# Patient Record
Sex: Male | Born: 1980 | Race: White | Hispanic: No | Marital: Single | Smoking: Current every day smoker
Health system: Southern US, Community
[De-identification: ages and names within clinical notes are randomized; demographics above are authoritative.]

## PROBLEM LIST (undated history)

## (undated) DIAGNOSIS — F909 Attention-deficit hyperactivity disorder, unspecified type: Secondary | ICD-10-CM

## (undated) HISTORY — PX: NOSE SURGERY: SHX723

## (undated) HISTORY — PX: OTHER SURGICAL HISTORY: SHX169

## (undated) HISTORY — DX: Attention-deficit hyperactivity disorder, unspecified type: F90.9

---

## 2006-10-24 ENCOUNTER — Ambulatory Visit: Payer: Self-pay | Admitting: Unknown Physician Specialty

## 2008-05-30 ENCOUNTER — Emergency Department: Payer: Self-pay | Admitting: Emergency Medicine

## 2008-08-02 ENCOUNTER — Ambulatory Visit: Payer: Self-pay | Admitting: Urology

## 2010-06-17 ENCOUNTER — Emergency Department: Payer: Self-pay | Admitting: Emergency Medicine

## 2011-11-22 ENCOUNTER — Ambulatory Visit: Payer: Self-pay | Admitting: Urology

## 2013-03-22 ENCOUNTER — Ambulatory Visit: Payer: Self-pay

## 2013-04-06 ENCOUNTER — Ambulatory Visit: Payer: Self-pay

## 2014-08-17 IMAGING — CT CT ABDOMEN AND PELVIS WITHOUT AND WITH CONTRAST
2 of 4 series · 13 of 32 positions shown, 18 images · non-contrast
Comparison: none

REASON FOR EXAM: hematuria
COMMENTS:

[Series 4: with 3.0 i40f 3 · axial · 0.78mm/px · z∈[-1055,-677]mm · 8 of 162 slices shown, 13 images]
[im 18/162  soft-tissue]
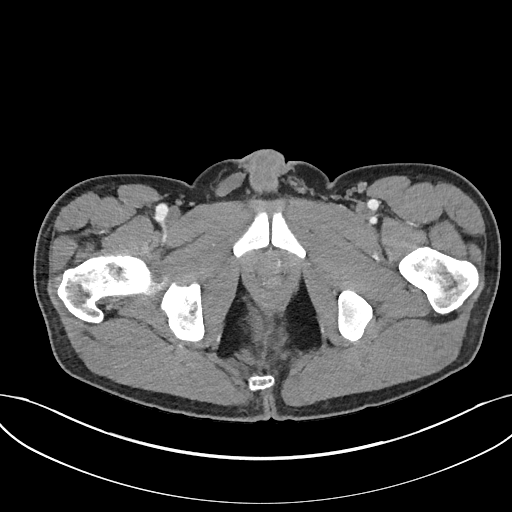
[im 18/162  bone]
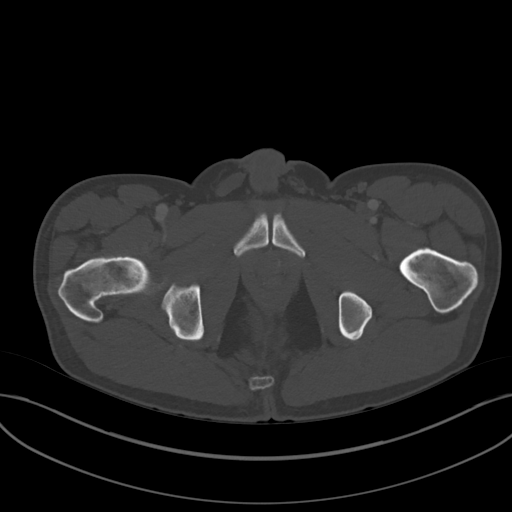
[im 36/162  soft-tissue]
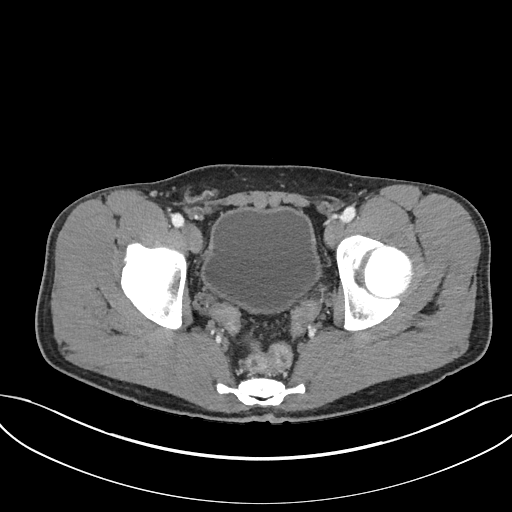
[im 54/162  soft-tissue]
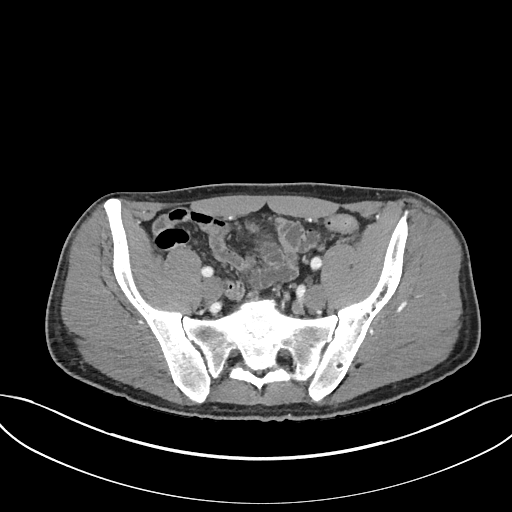
[im 72/162  soft-tissue]
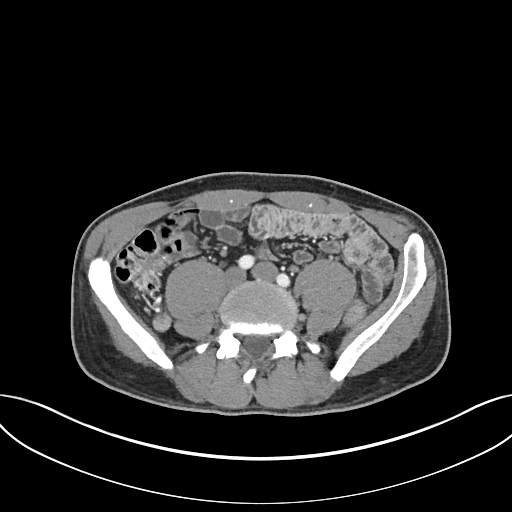
[im 90/162  soft-tissue]
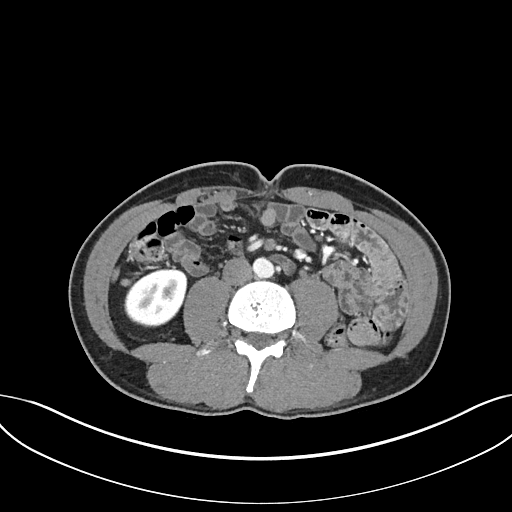
[im 90/162  lung]
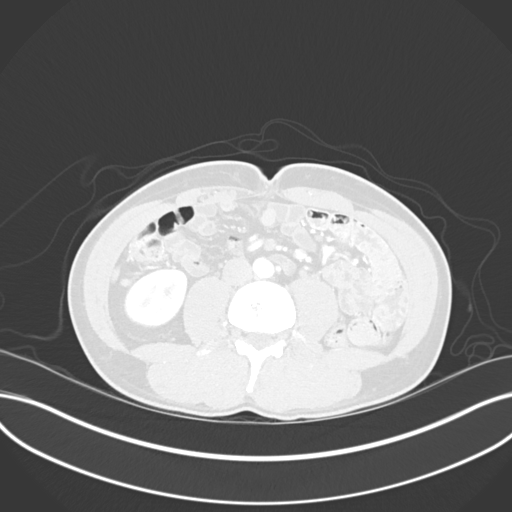
[im 108/162  soft-tissue]
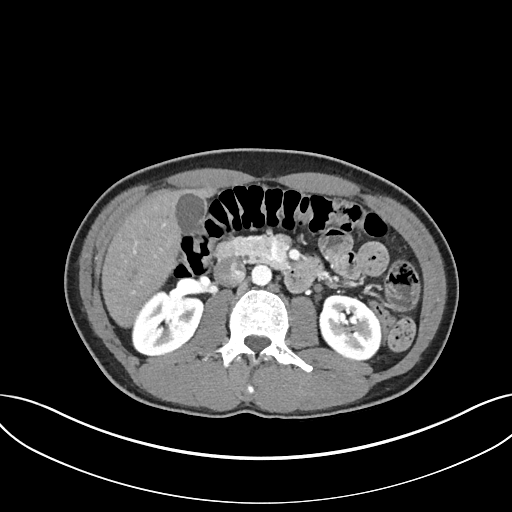
[im 108/162  lung]
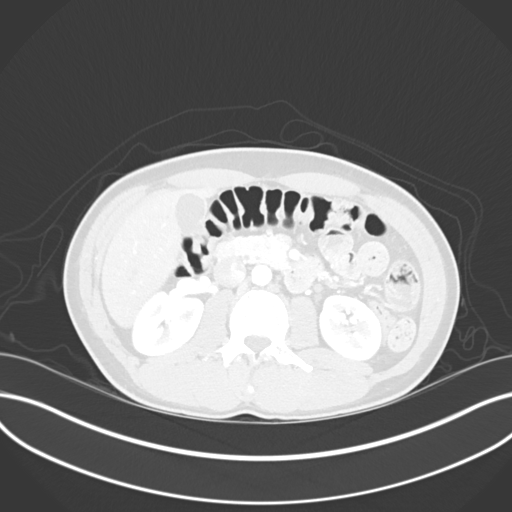
[im 126/162  soft-tissue]
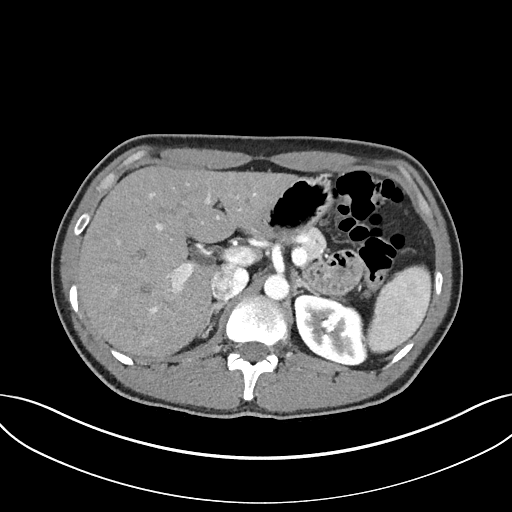
[im 126/162  lung]
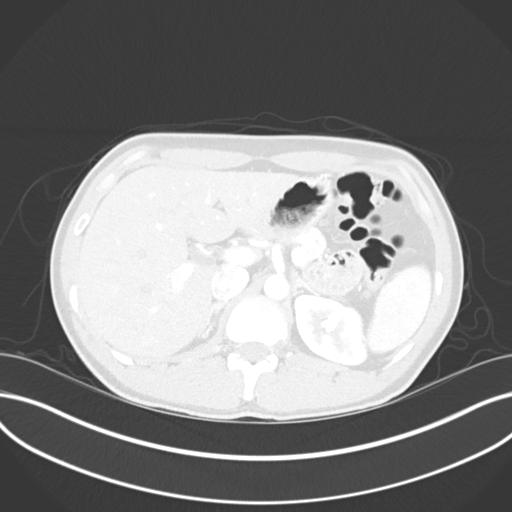
[im 144/162  soft-tissue]
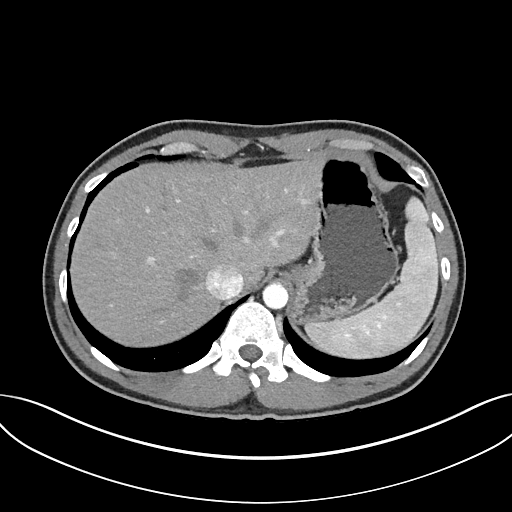
[im 144/162  lung]
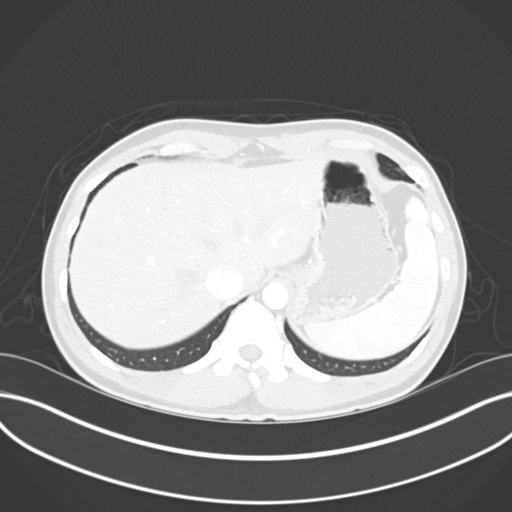

[Series 6: delay 3.0 i40f 3 · axial · delayed · 0.78mm/px · z∈[-1055,-839]mm · 5 of 162 slices shown]
[im 18/162  soft-tissue]
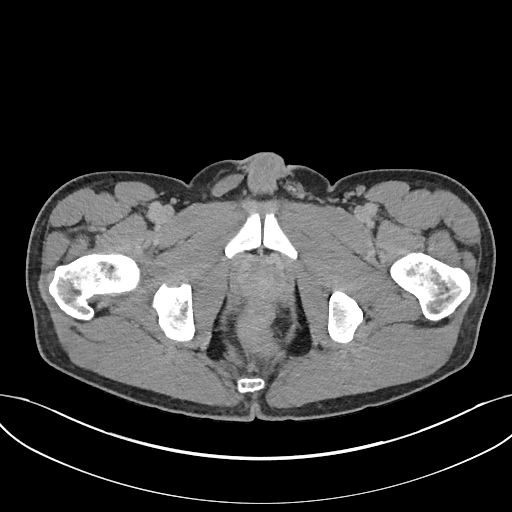
[im 36/162  soft-tissue]
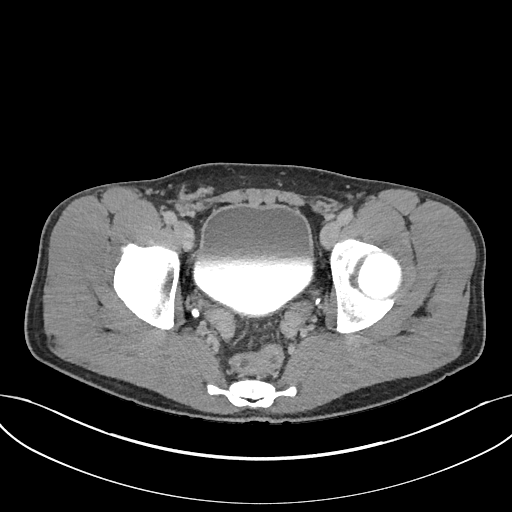
[im 54/162  soft-tissue]
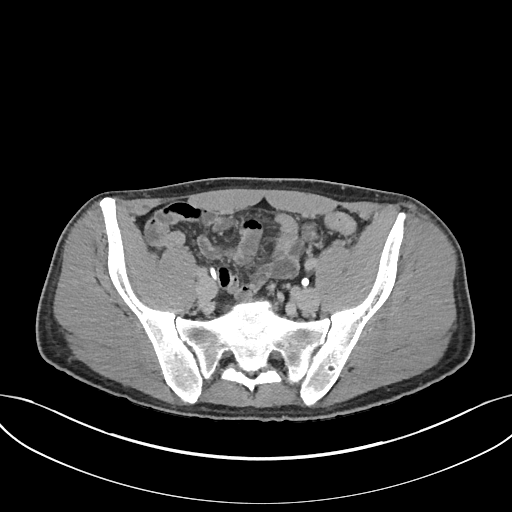
[im 72/162  soft-tissue]
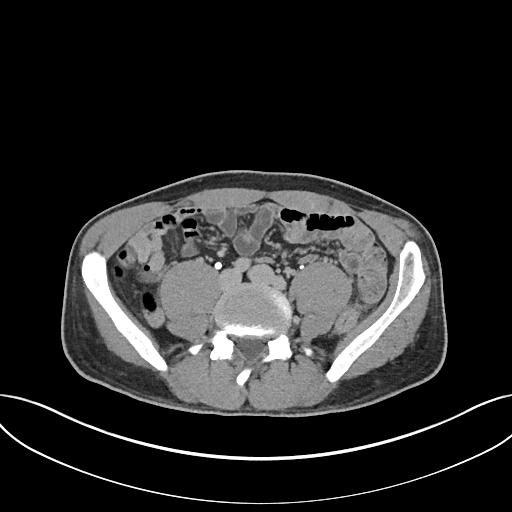
[im 90/162  soft-tissue]
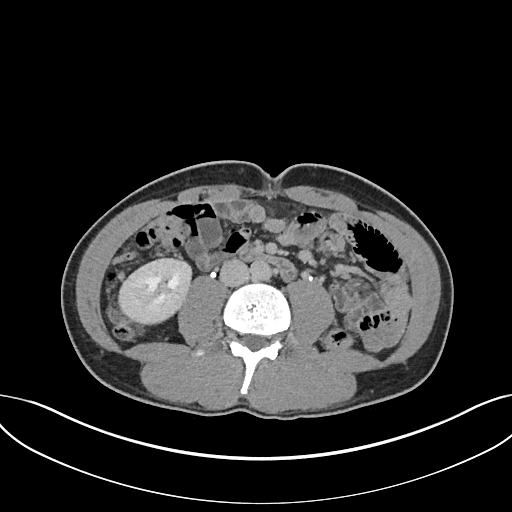

[13 of 32 positions shown; findings below may reference images not displayed]

PROCEDURE:     KCT - KCT ABDOMEN/PELVIS W/WO  - March 22, 2013 [DATE]

RESULT:     A triphasic CT scan was performed through the abdomen and pelvis
with reconstructions at 3 mm intervals and slice thicknesses. The patient
received 100 cc of Psovue-MDQ for the postcontrast and delayed images. The
patient did not receive oral contrast material.

On the noncontrast images the kidneys are normal in contour. There are no
calcified stones or parenchymal calcifications demonstrated. No calcified
ureteral stones are demonstrated. The partially distended urinary bladder
appears normal. Following contrast administration the renal parenchymal
enhancement pattern is normal. On delayed images contrast is visible within
both normal calibered ureters. The partially distended urinary bladder is
normal in appearance. The prostate gland produces a mildly irregular
impression upon the urinary bladder base.

The liver exhibits no focal mass nor ductal dilation. The gallbladder is
adequately distended with no evidence of stones or inflammatory change. The
pancreas, spleen, partially distended stomach, adrenal glands, and abdominal
aorta are normal in appearance. There is no periaortic nor pericaval
lymphadenopathy.

The unopacified loops of small and large bowel exhibit no evidence of ileus
nor of obstruction. The appendix is normal in caliber and partially
gas-filled and lies adjacent to the posterior aspect of the tip of the right
lobe of the liver. The right testicle may be undescended. There is a complex
appearance of the right inguinal canal. A classic inguinal hernia is not
demonstrated.

The lumbar vertebral bodies are preserved in height. The lung bases are
clear.
IMPRESSION: 1. The right testicle may be still located in the inguinal canal. There is
fluid density within a somewhat complex appearing right inguinal canal. The
left inguinal canal is more normal in appearance. Scrotal ultrasound would
be useful.
2. The prostate gland is mildly enlarged and produces regular impression
upon the urinary bladder base.
3. No urinary tract stones or obstruction are demonstrated.
4. I do not see acute bowel abnormality. A normal calibered uninflamed
appendix is demonstrated adjacent to the posterior aspect of the tip of the
right lobe of the liver.

[REDACTED]
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
## 2017-07-22 ENCOUNTER — Emergency Department (HOSPITAL_COMMUNITY)
Admission: EM | Admit: 2017-07-22 | Discharge: 2017-07-22 | Disposition: A | Discharge status: Home / Self Care | Payer: No Typology Code available for payment source | Attending: Emergency Medicine | Admitting: Emergency Medicine

## 2017-07-22 DIAGNOSIS — R0789 Other chest pain: Secondary | ICD-10-CM | POA: Diagnosis present

## 2017-07-22 DIAGNOSIS — Y9389 Activity, other specified: Secondary | ICD-10-CM | POA: Diagnosis not present

## 2017-07-22 DIAGNOSIS — Y999 Unspecified external cause status: Secondary | ICD-10-CM | POA: Insufficient documentation

## 2017-07-22 DIAGNOSIS — Y92410 Unspecified street and highway as the place of occurrence of the external cause: Secondary | ICD-10-CM | POA: Insufficient documentation
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
## 2017-07-22 NOTE — ED Notes (Signed)
Pt seen by PA , pt discharged home

## 2017-07-22 NOTE — ED Notes (Signed)
Pt is wanting to sign out ama . PA at bedside to talk with pt

## 2017-07-22 NOTE — Discharge Instructions (Signed)
Expect your soreness to increase over the next 2-3 days. Take it easy, but do not lay around too much as this may make any stiffness worse.  Antiinflammatory medications: Take 600 mg of ibuprofen every 6 hours or 440 mg (over the counter dose) to 500 mg (prescription dose) of naproxen every 12 hours for the next 3 days. After this time, these medications may be used as needed for pain. Take these medications with food to avoid upset stomach. Choose only one of these medications, do not take them together.  Tylenol: Should you continue to have additional pain while taking the ibuprofen or naproxen, you may add in tylenol as needed. Your daily total maximum amount of tylenol from all sources should be limited to 4000mg /day for persons without liver problems, or 2000mg /day for those with liver problems.  Lidocaine patches: These are available via either prescription or over-the-counter. The over-the-counter option may be more economical one and are likely just as effective. There are multiple over-the-counter brands, such as Salonpas. Exercises: Be sure to perform the attached exercises starting with three times a week and working up to performing them daily. This is an essential part of preventing long term problems.   Follow up with a primary care provider for any future management of these complaints.
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
## 2020-09-14 ENCOUNTER — Other Ambulatory Visit: Payer: Self-pay | Admitting: Family Medicine

## 2020-09-14 DIAGNOSIS — S060X9A Concussion with loss of consciousness of unspecified duration, initial encounter: Secondary | ICD-10-CM

## 2020-09-14 DIAGNOSIS — R110 Nausea: Secondary | ICD-10-CM

## 2020-09-14 DIAGNOSIS — R519 Headache, unspecified: Secondary | ICD-10-CM

## 2020-09-14 DIAGNOSIS — S060X9D Concussion with loss of consciousness of unspecified duration, subsequent encounter: Secondary | ICD-10-CM

## 2020-09-14 DIAGNOSIS — R42 Dizziness and giddiness: Secondary | ICD-10-CM

## 2021-03-22 ENCOUNTER — Encounter: Payer: Self-pay | Admitting: Physician Assistant

## 2021-03-22 ENCOUNTER — Ambulatory Visit: Payer: 59 | Admitting: Physician Assistant

## 2021-03-22 ENCOUNTER — Other Ambulatory Visit: Payer: Self-pay

## 2021-03-22 VITALS — BP 120/90 | HR 100 | Temp 97.8°F | Resp 16 | Ht 72.0 in | Wt 193.0 lb

## 2021-03-22 DIAGNOSIS — F909 Attention-deficit hyperactivity disorder, unspecified type: Secondary | ICD-10-CM | POA: Diagnosis not present

## 2021-03-22 DIAGNOSIS — Z7689 Persons encountering health services in other specified circumstances: Secondary | ICD-10-CM

## 2021-03-22 DIAGNOSIS — E782 Mixed hyperlipidemia: Secondary | ICD-10-CM | POA: Diagnosis not present

## 2021-03-22 DIAGNOSIS — R5383 Other fatigue: Secondary | ICD-10-CM

## 2021-03-22 DIAGNOSIS — R7989 Other specified abnormal findings of blood chemistry: Secondary | ICD-10-CM | POA: Diagnosis not present

## 2021-03-22 MED ORDER — AMPHETAMINE-DEXTROAMPHET ER 30 MG PO CP24: 30.0000 mg | ORAL_CAPSULE | Freq: Every day | ORAL | 0 refills | Status: DC

## 2021-03-22 MED ORDER — AMPHETAMINE-DEXTROAMPHETAMINE 10 MG PO TABS: 10.0000 mg | ORAL_TABLET | Freq: Every day | ORAL | 0 refills | Status: DC

## 2021-03-22 MED ORDER — PNEUMOCOCCAL 20-VAL CONJ VACC 0.5 ML IM SUSY: 0.5000 mL | PREFILLED_SYRINGE | INTRAMUSCULAR | 0 refills | Status: AC

## 2021-03-22 NOTE — Progress Notes (Signed)
[facility] [address] [place], [place] [address]  Internal MEDICINE  Office Visit Note  Patient Name: Henry Salas  [id number]  [id number]  Date of Service: 03/25/2021   Complaints/HPI Pt is here for establishment of PCP. Chief Complaint  Patient presents with   New Patient (Initial Visit)    Anxiety, wants epi pen, discuss deviated septum    HPI He is here to establish care -His fiance comes here and recommended he come establish care here. Reports issues with prior PCP getting meds filled. -he works as a Merchandiser, retail at [employer]. Goes to work at [employer] and then doesn't get home until early morning. Working to adjust and start driving on the truck and getting normal shift. -Sleeps 7-8 hours -He has a dog -enjoys wakeboarding and traveling to beach and mountains -Used to dip tobacco and quit, he is vaping now everyday but is trying to quit -Mother had a stoke 81months ago and working on getting her into an assisted living and has been stressful -Does not drink any alcohol, denies any other substance use -He has been without testosterone since end of June. Has been feeling more fatigued and is looking to get this prescription filled today. Did discuss that we will need to obtain labs first -He also had ADHD and takes adderall 20mg  in AM plus additional 10mg  pill in Am and then another 10mg  in PM, skips weekends and days he is not working. Would be interesting in changing to 30mg  dose in Am with 10mg  in PM rather than having to take a 20mg  and a 10mg  separately in the AM. Discussed that a 30mg  XR pill could be given and that he should try to go without the 10mg  in PM to see if the higher XR dosing works well on its own. -Denies any problems with sleep or any heart palpitations -Additionally discussed that his BP will have to remain under control -Patient did bring in his medication bottles for his adderall and testosterone gel  Current Medication: Outpatient Encounter  Medications as of 03/22/2021  Medication Sig   amphetamine-dextroamphetamine (ADDERALL XR) 30 MG 24 hr capsule Take 1 capsule (30 mg total) by mouth daily.   amphetamine-dextroamphetamine (ADDERALL) 10 MG tablet Take 1 tablet (10 mg total) by mouth daily with breakfast.   Testosterone 1.62 % GEL Place onto the skin.   [DISCONTINUED] amphetamine-dextroamphetamine (ADDERALL) 10 MG tablet Take 10 mg by mouth daily with breakfast.   [DISCONTINUED] amphetamine-dextroamphetamine (ADDERALL) 20 MG tablet Take 20 mg by mouth daily.   [DISCONTINUED] pneumococcal 20-Val Conj Vacc (PREVNAR 20) 0.5 ML injection Inject 0.5 mLs into the muscle tomorrow at 10 am.   [EXPIRED] pneumococcal 20-Val Conj Vacc (PREVNAR 20) 0.5 ML injection Inject 0.5 mLs into the muscle tomorrow at 10 am for 1 dose.   No facility-administered encounter medications on file as of 03/22/2021.    Surgical History: Past Surgical History:  Procedure Laterality Date   NOSE SURGERY     ulnar nerve deep compression Left     Medical History: Past Medical History:  Diagnosis Date   ADHD     Family History: Family History  Problem Relation Age of Onset   Hyperlipidemia Mother    Depression Mother    Hypertension Mother    Diabetes Mother    Stroke Mother    Arthritis Mother    Hyperlipidemia Father    Cancer Father    Cancer Paternal Uncle    Stroke Maternal Grandmother    Hyperlipidemia  Maternal Grandmother    Diabetes Maternal Grandmother    Heart disease Maternal Grandmother    Hyperlipidemia Maternal Grandfather    COPD Maternal Grandfather    Heart disease Maternal Grandfather    Hyperlipidemia Paternal Grandmother    Cancer Paternal Grandmother    Hyperlipidemia Paternal Grandfather    Heart disease Paternal Grandfather     Social History   Socioeconomic History   Marital status: Single    Spouse name: Not on file   Number of children: Not on file   Years of education: Not on file   Highest education level:  Not on file  Occupational History   Not on file  Tobacco Use   Smoking status: Every Day    Types: E-cigarettes   Smokeless tobacco: Former    Types: Chew  Substance and Sexual Activity   Alcohol use: Yes    Comment: 2 drinks a year   Drug use: Never   Sexual activity: Not on file  Other Topics Concern   Not on file  Social History Narrative   Not on file   Social Determinants of Health   Financial Resource Strain: Not on file  Food Insecurity: Not on file  Transportation Needs: Not on file  Physical Activity: Not on file  Stress: Not on file  Social Connections: Not on file  Intimate Partner Violence: Not on file     Review of Systems  Constitutional:  Positive for fatigue. Negative for chills and unexpected weight change.  HENT:  Negative for congestion, postnasal drip, rhinorrhea, sneezing and sore throat.   Eyes:  Negative for redness.  Respiratory:  Negative for cough, chest tightness and shortness of breath.   Cardiovascular:  Negative for chest pain and palpitations.  Gastrointestinal:  Negative for abdominal pain, constipation, diarrhea, nausea and vomiting.  Genitourinary:  Negative for dysuria and frequency.  Musculoskeletal:  Negative for arthralgias, back pain, joint swelling and neck pain.  Skin:  Negative for rash.  Neurological: Negative.  Negative for tremors and numbness.  Hematological:  Negative for adenopathy. Does not bruise/bleed easily.  Psychiatric/Behavioral:  Positive for decreased concentration. Negative for behavioral problems (Depression), sleep disturbance and suicidal ideas. The patient is nervous/anxious.    Vital Signs: BP 120/90 Comment: 120/96  Pulse 100   Temp 97.8 F (36.6 C)   Resp 16   Ht 6' (1.829 m)   Wt 193 lb (87.5 kg)   SpO2 98%   BMI 26.18 kg/m    Physical Exam Vitals and nursing note reviewed.  Constitutional:      General: He is not in acute distress.    Appearance: He is well-developed and normal weight. He  is not diaphoretic.  HENT:     Head: Normocephalic and atraumatic.     Mouth/Throat:     Pharynx: No oropharyngeal exudate.  Eyes:     Pupils: Pupils are equal, round, and reactive to light.  Neck:     Thyroid: No thyromegaly.     Vascular: No JVD.     Trachea: No tracheal deviation.  Cardiovascular:     Rate and Rhythm: Normal rate and regular rhythm.     Heart sounds: Normal heart sounds. No murmur heard.   No friction rub. No gallop.  Pulmonary:     Effort: Pulmonary effort is normal. No respiratory distress.     Breath sounds: No wheezing or rales.  Chest:     Chest wall: No tenderness.  Abdominal:     General: Bowel sounds  are normal.     Palpations: Abdomen is soft.  Musculoskeletal:        General: Normal range of motion.     Cervical back: Normal range of motion and neck supple.  Lymphadenopathy:     Cervical: No cervical adenopathy.  Skin:    General: Skin is warm and dry.  Neurological:     Mental Status: He is alert and oriented to person, place, and time.     Cranial Nerves: No cranial nerve deficit.  Psychiatric:        Behavior: Behavior normal.        Thought Content: Thought content normal.        Judgment: Judgment normal.      Assessment/Plan: 1. 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 Attention deficit hyperactivity disorder (ADHD), unspecified ADHD type Will start on 30mg  adderall XR in AM, and may take 10mg  in PM if needed. Will skip weekends/non-workdays  4. Mixed hyperlipidemia - Lipid Panel With LDL/HDL Ratio  5. Other fatigue - CBC w/Diff/Platelet - Comprehensive metabolic panel - TSH + free T4   General Counseling: Kane verbalizes understanding of the findings of todays visit and agrees with plan of treatment. I have discussed any further diagnostic evaluation  that may be needed or ordered today. We also reviewed his medications today. he has been encouraged to call the office with any questions or concerns that should arise related to todays visit.    Counseling:    Orders Placed This Encounter  Procedures   Testosterone,Free and Total   CBC w/Diff/Platelet   Comprehensive metabolic panel   Lipid Panel With LDL/HDL Ratio   TSH + free T4     Meds ordered this encounter  Medications   pneumococcal 20-Val Conj Vacc (PREVNAR 20) 0.5 ML injection    Sig: Inject 0.5 mLs into the muscle tomorrow at 10 am for 1 dose.    Dispense:  0.5 mL    Refill:  0   amphetamine-dextroamphetamine (ADDERALL XR) 30 MG 24 hr capsule    Sig: Take 1 capsule (30 mg total) by mouth daily.    Dispense:  30 capsule    Refill:  0   amphetamine-dextroamphetamine (ADDERALL) 10 MG tablet    Sig: Take 1 tablet (10 mg total) by mouth daily with breakfast.    Dispense:  30 tablet    Refill:  0      This patient was seen by , PA-C in collaboration with Dr. Jomarie Longs as a part of collaborative care agreement.   Time spent:40 Minutes

## 2021-03-28 ENCOUNTER — Telehealth: Payer: Self-pay

## 2021-03-28 NOTE — Telephone Encounter (Signed)
Received medical records from [facility]. Sent to be scanned-Toni

## 2021-03-29 LAB — CBC WITH DIFFERENTIAL/PLATELET
Basophils Absolute: 0 10*3/uL (ref 0.0–0.2)
Basos: 1 %
EOS (ABSOLUTE): 0.1 10*3/uL (ref 0.0–0.4)
Eos: 2 %
Hematocrit: 39.7 % (ref 37.5–51.0)
Hemoglobin: 12.7 g/dL — ABNORMAL LOW (ref 13.0–17.7)
Immature Grans (Abs): 0 10*3/uL (ref 0.0–0.1)
Immature Granulocytes: 0 %
Lymphocytes Absolute: 2.5 10*3/uL (ref 0.7–3.1)
Lymphs: 41 %
MCH: 25.8 pg — ABNORMAL LOW (ref 26.6–33.0)
MCHC: 32 g/dL (ref 31.5–35.7)
MCV: 81 fL (ref 79–97)
Monocytes Absolute: 0.5 10*3/uL (ref 0.1–0.9)
Monocytes: 8 %
Neutrophils Absolute: 3 10*3/uL (ref 1.4–7.0)
Neutrophils: 48 %
Platelets: 255 10*3/uL (ref 150–450)
RBC: 4.93 x10E6/uL (ref 4.14–5.80)
RDW: 15.7 % — ABNORMAL HIGH (ref 11.6–15.4)
WBC: 6.1 10*3/uL (ref 3.4–10.8)

## 2021-03-29 LAB — COMPREHENSIVE METABOLIC PANEL
ALT: 11 IU/L (ref 0–44)
AST: 14 IU/L (ref 0–40)
Albumin/Globulin Ratio: 1.6 (ref 1.2–2.2)
Albumin: 4.4 g/dL (ref 4.0–5.0)
Alkaline Phosphatase: 71 IU/L (ref 44–121)
BUN/Creatinine Ratio: 16 (ref 9–20)
BUN: 18 mg/dL (ref 6–24)
Bilirubin Total: 0.3 mg/dL (ref 0.0–1.2)
CO2: 27 mmol/L (ref 20–29)
Calcium: 9.3 mg/dL (ref 8.7–10.2)
Chloride: 99 mmol/L (ref 96–106)
Creatinine, Ser: 1.15 mg/dL (ref 0.76–1.27)
Globulin, Total: 2.8 g/dL (ref 1.5–4.5)
Glucose: 96 mg/dL (ref 65–99)
Potassium: 4.7 mmol/L (ref 3.5–5.2)
Sodium: 138 mmol/L (ref 134–144)
Total Protein: 7.2 g/dL (ref 6.0–8.5)
eGFR: 82 mL/min/{1.73_m2} (ref >59)

## 2021-03-29 LAB — LIPID PANEL WITH LDL/HDL RATIO
Cholesterol, Total: 207 mg/dL — ABNORMAL HIGH (ref 100–199)
HDL: 43 mg/dL (ref >39)
LDL Chol Calc (NIH): 138 mg/dL — ABNORMAL HIGH (ref 0–99)
LDL/HDL Ratio: 3.2 ratio (ref 0.0–3.6)
Triglycerides: 143 mg/dL (ref 0–149)
VLDL Cholesterol Cal: 26 mg/dL (ref 5–40)

## 2021-03-29 LAB — TSH+FREE T4
Free T4: 0.9 ng/dL (ref 0.82–1.77)
TSH: 3.15 u[IU]/mL (ref 0.450–4.500)

## 2021-03-29 LAB — TESTOSTERONE,FREE AND TOTAL
Testosterone, Free: 4.4 pg/mL — ABNORMAL LOW (ref 6.8–21.5)
Testosterone: 101 ng/dL — ABNORMAL LOW (ref 264–916)

## 2021-03-30 ENCOUNTER — Telehealth: Payer: Self-pay

## 2021-03-30 ENCOUNTER — Other Ambulatory Visit: Payer: Self-pay | Admitting: Physician Assistant

## 2021-03-30 DIAGNOSIS — R7989 Other specified abnormal findings of blood chemistry: Secondary | ICD-10-CM

## 2021-03-30 MED ORDER — TESTOSTERONE 1.62 % TD GEL: 2.0000 | Freq: Every day | TRANSDERMAL | 1 refills | Status: DC

## 2021-03-30 NOTE — Telephone Encounter (Signed)
Per lauren I Called and spoke to pt about his labs on hemoglobin, cholesterol and needs to work on his diet and exercise for cholesterol and with his testosterone gel pt uses 2 pumps every morning. Informed Lauren about pt's dose of testosterone gel and she will send prescription to pharmacy

## 2021-03-30 NOTE — Telephone Encounter (Signed)
Called pt and informed him on his labs for cholesterol and

## 2021-03-30 NOTE — Telephone Encounter (Signed)
-----   Message from Carlean Jews, PA-C sent at 03/30/2021  1:50 PM EDT ----- Can you let patient know that his hemoglobin is a little low, but is improved from last year's labs by previous provider. His cholesterol was also high and needs to work on his diet and exercise especially with adding testosterone. His testosterone was low again and will restart his gel--please confirm he was previously doing 2 pumps every morning.  Thanks!

## 2021-04-25 ENCOUNTER — Other Ambulatory Visit: Payer: Self-pay | Admitting: Physician Assistant

## 2021-04-25 ENCOUNTER — Telehealth: Payer: Self-pay

## 2021-04-25 DIAGNOSIS — F909 Attention-deficit hyperactivity disorder, unspecified type: Secondary | ICD-10-CM

## 2021-04-25 MED ORDER — AMPHETAMINE-DEXTROAMPHET ER 30 MG PO CP24: 30.0000 mg | ORAL_CAPSULE | Freq: Every day | ORAL | 0 refills | Status: DC

## 2021-04-25 MED ORDER — AMPHETAMINE-DEXTROAMPHETAMINE 10 MG PO TABS: 10.0000 mg | ORAL_TABLET | Freq: Every day | ORAL | 0 refills | Status: DC

## 2021-04-25 NOTE — Telephone Encounter (Signed)
Spoke with pt, informed him med was sent to pharmacy and provided appt time 11AM for 05-07-21

## 2021-05-07 ENCOUNTER — Encounter: Payer: 59 | Admitting: Physician Assistant

## 2021-06-12 ENCOUNTER — Telehealth: Payer: Self-pay

## 2021-06-12 NOTE — Telephone Encounter (Signed)
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 He will bring to office morning of 06/14/21 to have completed-Toni

## 2021-06-21 ENCOUNTER — Ambulatory Visit: Payer: Medicaid Other | Admitting: Physician Assistant

## 2021-07-10 ENCOUNTER — Ambulatory Visit: Payer: Medicaid Other | Admitting: Nurse Practitioner

## 2021-07-19 ENCOUNTER — Ambulatory Visit (INDEPENDENT_AMBULATORY_CARE_PROVIDER_SITE_OTHER): Payer: Self-pay | Admitting: Physician Assistant

## 2021-07-19 ENCOUNTER — Other Ambulatory Visit: Payer: Self-pay

## 2021-07-19 ENCOUNTER — Telehealth: Payer: Self-pay

## 2021-07-19 ENCOUNTER — Encounter: Payer: Self-pay | Admitting: Physician Assistant

## 2021-07-19 DIAGNOSIS — F909 Attention-deficit hyperactivity disorder, unspecified type: Secondary | ICD-10-CM

## 2021-07-19 DIAGNOSIS — E782 Mixed hyperlipidemia: Secondary | ICD-10-CM

## 2021-07-19 DIAGNOSIS — R7989 Other specified abnormal findings of blood chemistry: Secondary | ICD-10-CM

## 2021-07-19 MED ORDER — TESTOSTERONE 1.62 % TD GEL: 2.0000 | Freq: Every day | TRANSDERMAL | 1 refills | Status: AC

## 2021-07-19 MED ORDER — AMPHETAMINE-DEXTROAMPHETAMINE 10 MG PO TABS: 10.0000 mg | ORAL_TABLET | Freq: Every day | ORAL | 0 refills | Status: DC

## 2021-07-19 NOTE — Progress Notes (Signed)
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 Now on normal hours and trying to shift sleep schedule back to normal. -Adderall 30mg  XR almost relaxed him too much and wants to stop and just take the 10mg  as this seems adequate at helping with concentration and focus.  -Doesn't take it on the weekends/if not working  -More energy now with testosterone replacement -Diet and exercise changing now with new routine. More physical work during the day now and able to come home and eat at home rather than fast food at 2am and end of shift. Is mindful of his elevated lipids and is working to make improvements without medication first. -Had his DOT physical and said it went well, but they need his adderall signed off on additional form today since I am prescribing this for ADHD. Form signed and given to patient. -Denies any sleepiness while driving and adderall helps him to focus on work days. This is not a new medication for him, we have just been working to adjust dose.  Current Medication: Outpatient Encounter Medications as of 07/19/2021  Medication Sig   [DISCONTINUED] amphetamine-dextroamphetamine (ADDERALL XR) 30 MG 24 hr capsule Take 1 capsule (30 mg total) by mouth daily.   [DISCONTINUED] amphetamine-dextroamphetamine (ADDERALL) 10 MG tablet Take 1 tablet (10 mg total) by mouth daily with breakfast.   [DISCONTINUED] Testosterone 1.62 % GEL Place 2 Pump onto the skin daily.   amphetamine-dextroamphetamine (ADDERALL) 10 MG tablet Take 1 tablet (10 mg total) by mouth daily with breakfast.   Testosterone 1.62 % GEL Place 2 Pump onto the skin daily.   No  facility-administered encounter medications on file as of 07/19/2021.    Surgical History: Past Surgical History:  Procedure Laterality Date   NOSE SURGERY     ulnar nerve deep compression Left     Medical History: Past Medical History:  Diagnosis Date   ADHD     Family History: Family History  Problem Relation Age of Onset   Hyperlipidemia Mother    Depression Mother    Hypertension Mother    Diabetes Mother    Stroke Mother    Arthritis Mother    Hyperlipidemia Father    Cancer Father    Cancer Paternal Uncle    Stroke Maternal Grandmother    Hyperlipidemia Maternal Grandmother    Diabetes Maternal Grandmother    Heart disease Maternal Grandmother    Hyperlipidemia Maternal Grandfather    COPD Maternal Grandfather    Heart disease Maternal Grandfather    Hyperlipidemia Paternal Grandmother    Cancer Paternal Grandmother    Hyperlipidemia Paternal Grandfather    Heart disease Paternal Grandfather     Social History   Socioeconomic History   Marital status: Single    Spouse name: Not on file   Number of children: Not on file   Years of education: Not on file   Highest education level: Not on file  Occupational History   Not on file  Tobacco Use   Smoking status: Every Day    Types: E-cigarettes   Smokeless tobacco: Former    Types: Chew  Substance and Sexual Activity  Alcohol use: Yes    Comment: 2 drinks a year   Drug use: Never   Sexual activity: Not on file  Other Topics Concern   Not on file  Social History Narrative   Not on file   Social Determinants of Health   Financial Resource Strain: Not on file  Food Insecurity: Not on file  Transportation Needs: Not on file  Physical Activity: Not on file  Stress: Not on file  Social Connections: Not on file  Intimate Partner Violence: Not on file      Review of Systems  Constitutional:  Negative for chills, fatigue and unexpected weight change.  HENT:  Negative for congestion, postnasal  drip, rhinorrhea, sneezing and sore throat.   Eyes:  Negative for redness.  Respiratory:  Negative for cough, chest tightness and shortness of breath.   Cardiovascular:  Negative for chest pain and palpitations.  Gastrointestinal:  Negative for abdominal pain, constipation, diarrhea, nausea and vomiting.  Genitourinary:  Negative for dysuria and frequency.  Musculoskeletal:  Negative for arthralgias, back pain, joint swelling and neck pain.  Skin:  Negative for rash.  Neurological: Negative.  Negative for tremors and numbness.  Hematological:  Negative for adenopathy. Does not bruise/bleed easily.  Psychiatric/Behavioral:  Negative for behavioral problems (Depression), sleep disturbance and suicidal ideas. The patient is nervous/anxious and is hyperactive.    Vital Signs: BP 140/78   Pulse 81   Temp 97.8 F (36.6 C)   Resp 16   Ht 6' (1.829 m)   Wt 195 lb (88.5 kg)   SpO2 95%   BMI 26.45 kg/m    Physical Exam Vitals and nursing note reviewed.  Constitutional:      General: He is not in acute distress.    Appearance: He is well-developed and normal weight. He is not diaphoretic.  HENT:     Head: Normocephalic and atraumatic.     Mouth/Throat:     Pharynx: No oropharyngeal exudate.  Eyes:     Pupils: Pupils are equal, round, and reactive to light.  Neck:     Thyroid: No thyromegaly.     Vascular: No JVD.     Trachea: No tracheal deviation.  Cardiovascular:     Rate and Rhythm: Normal rate and regular rhythm.     Heart sounds: Normal heart sounds. No murmur heard.   No friction rub. No gallop.  Pulmonary:     Effort: Pulmonary effort is normal. No respiratory distress.     Breath sounds: No wheezing or rales.  Chest:     Chest wall: No tenderness.  Abdominal:     General: Bowel sounds are normal.     Palpations: Abdomen is soft.  Musculoskeletal:        General: Normal range of motion.     Cervical back: Normal range of motion and neck supple.  Lymphadenopathy:      Cervical: No cervical adenopathy.  Skin:    General: Skin is warm and dry.  Neurological:     Mental Status: He is alert and oriented to person, place, and time.     Cranial Nerves: No cranial nerve deficit.  Psychiatric:        Behavior: Behavior normal.        Thought Content: Thought content normal.        Judgment: Judgment normal.       Assessment/Plan: 1. Attention deficit hyperactivity disorder (ADHD), unspecified ADHD type May continue adderall as needed, will need UDS next visit - amphetamine-dextroamphetamine (  ADDERALL) 10 MG tablet; Take 1 tablet (10 mg total) by mouth daily with breakfast.  Dispense: 30 tablet; Refill: 0 [place] Controlled Substance Database was reviewed by me for overdose risk score (ORS) Refilled Controlled medications today. Reviewed risks and possible side effects associated with taking Stimulants. Combination of these drugs with other psychotropic medications could cause dizziness and drowsiness. Pt needs to Monitor symptoms and exercise caution in driving and operating heavy machinery to avoid damages to oneself, to others and to the surroundings. Patient verbalized understanding in this matter. Dependence and abuse for these drugs will be monitored closely. A Controlled substance policy and procedure is on file which allows [facility] to order a urine drug screen test at any visit. Patient understands and agrees with the plan..   2. Low testosterone in male May continue testosterone replacement. Advised we will need to monitor labs in addition to lipids - Testosterone 1.62 % GEL; Place 2 Pump onto the skin daily.  Dispense: 75 g; Refill: 1  3. Mixed hyperlipidemia Continue to improve diet and exercise, will need updated labs after next visit   General Counseling: misty rago understanding of the findings of todays visit and agrees with plan of treatment. I have discussed any further diagnostic evaluation that may be needed or ordered  today. We also reviewed his medications today. he has been encouraged to call the office with any questions or concerns that should arise related to todays visit.    No orders of the defined types were placed in this encounter.   Meds ordered this encounter  Medications   amphetamine-dextroamphetamine (ADDERALL) 10 MG tablet    Sig: Take 1 tablet (10 mg total) by mouth daily with breakfast.    Dispense:  30 tablet    Refill:  0   Testosterone 1.62 % GEL    Sig: Place 2 Pump onto the skin daily.    Dispense:  75 g    Refill:  1     This patient was seen by Lynn Ito, PA-C in collaboration with Dr. Beverely Risen as a part of collaborative care agreement.   Total time spent:30 Minutes Time spent includes review of chart, medications, test results, and follow up plan with the patient.      Dr Lyndon Code Internal medicine

## 2021-07-19 NOTE — Telephone Encounter (Signed)
Patient was verbally advised at the time of checking in for his 07-19-21 ov that if he missed or cancelled a future appt there is a possibility that patient can no longer be seen at our office. Patient is with full understanding.

## 2021-07-19 NOTE — Telephone Encounter (Signed)
Medication form for Job completed and given to patient at the time of his appointment.

## 2021-09-14 ENCOUNTER — Other Ambulatory Visit: Payer: Self-pay

## 2021-09-14 ENCOUNTER — Other Ambulatory Visit: Payer: Self-pay | Admitting: Physician Assistant

## 2021-09-14 ENCOUNTER — Telehealth: Payer: Self-pay

## 2021-09-14 DIAGNOSIS — F909 Attention-deficit hyperactivity disorder, unspecified type: Secondary | ICD-10-CM

## 2021-09-14 MED ORDER — AMPHETAMINE-DEXTROAMPHETAMINE 10 MG PO TABS: 10.0000 mg | ORAL_TABLET | Freq: Every day | ORAL | 0 refills | Status: DC

## 2021-09-14 NOTE — Telephone Encounter (Signed)
Try to call pt several time unable to reach him we send med to phar

## 2021-10-15 ENCOUNTER — Ambulatory Visit: Payer: Medicaid Other | Admitting: Physician Assistant

## 2021-10-19 ENCOUNTER — Other Ambulatory Visit: Payer: Self-pay | Admitting: Physician Assistant

## 2021-10-19 DIAGNOSIS — F909 Attention-deficit hyperactivity disorder, unspecified type: Secondary | ICD-10-CM

## 2021-10-19 MED ORDER — AMPHETAMINE-DEXTROAMPHETAMINE 10 MG PO TABS: 10.0000 mg | ORAL_TABLET | Freq: Every day | ORAL | 0 refills | Status: AC

## 2021-11-05 ENCOUNTER — Ambulatory Visit: Payer: Medicaid Other | Admitting: Physician Assistant

## 2021-11-26 ENCOUNTER — Ambulatory Visit: Payer: Medicaid Other | Admitting: Physician Assistant

## 2022-08-23 ENCOUNTER — Telehealth: Payer: Self-pay | Admitting: Physician Assistant

## 2022-08-23 NOTE — Telephone Encounter (Signed)
Received MR request from [facility] [place] dating back to 09/14/21. Faxed back letting them know last visit was 07/19/2021; [phone]

## 2023-02-21 ENCOUNTER — Other Ambulatory Visit: Payer: Self-pay

## 2023-02-21 ENCOUNTER — Emergency Department: Payer: MEDICAID

## 2023-02-21 ENCOUNTER — Emergency Department
Admission: EM | Admit: 2023-02-21 | Discharge: 2023-02-21 | Disposition: A | Discharge status: Home / Self Care | Payer: MEDICAID | Attending: Emergency Medicine | Admitting: Emergency Medicine

## 2023-02-21 DIAGNOSIS — M25561 Pain in right knee: Secondary | ICD-10-CM | POA: Diagnosis present

## 2023-02-21 DIAGNOSIS — L03115 Cellulitis of right lower limb: Secondary | ICD-10-CM | POA: Insufficient documentation

## 2023-02-21 LAB — BASIC METABOLIC PANEL
Anion gap: 7 (ref 5–15)
BUN: 11 mg/dL (ref 6–20)
CO2: 29 mmol/L (ref 22–32)
Calcium: 8.6 mg/dL — ABNORMAL LOW (ref 8.9–10.3)
Chloride: 95 mmol/L — ABNORMAL LOW (ref 98–111)
Creatinine, Ser: 0.86 mg/dL (ref 0.61–1.24)
GFR, Estimated: >60 mL/min (ref >60)
Glucose, Bld: 133 mg/dL — ABNORMAL HIGH (ref 70–99)
Potassium: 3.8 mmol/L (ref 3.5–5.1)
Sodium: 131 mmol/L — ABNORMAL LOW (ref 135–145)

## 2023-02-21 LAB — CBC
HCT: 36.2 % — ABNORMAL LOW (ref 39.0–52.0)
Hemoglobin: 11.2 g/dL — ABNORMAL LOW (ref 13.0–17.0)
MCH: 25.6 pg — ABNORMAL LOW (ref 26.0–34.0)
MCHC: 30.9 g/dL (ref 30.0–36.0)
MCV: 82.8 fL (ref 80.0–100.0)
Platelets: 229 10*3/uL (ref 150–400)
RBC: 4.37 MIL/uL (ref 4.22–5.81)
RDW: 12.8 % (ref 11.5–15.5)
WBC: 10.2 10*3/uL (ref 4.0–10.5)
nRBC: 0 % (ref 0.0–0.2)

## 2023-02-21 MED ORDER — CLINDAMYCIN PHOSPHATE 600 MG/50ML IV SOLN
600.0000 mg | Freq: Once | INTRAVENOUS | Status: AC
  Administered 2023-02-21: 600 mg via INTRAVENOUS
  Filled 2023-02-21: qty 50

## 2023-02-21 MED ORDER — HYDROCODONE-ACETAMINOPHEN 5-325 MG PO TABS: 1.0000 | ORAL_TABLET | Freq: Four times a day (QID) | ORAL | 0 refills | Status: AC | PRN

## 2023-02-21 MED ORDER — CEPHALEXIN 500 MG PO CAPS: 500.0000 mg | ORAL_CAPSULE | Freq: Three times a day (TID) | ORAL | 0 refills | Status: AC

## 2023-02-21 MED ORDER — SULFAMETHOXAZOLE-TRIMETHOPRIM 800-160 MG PO TABS: 1.0000 | ORAL_TABLET | Freq: Two times a day (BID) | ORAL | 0 refills | Status: AC
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
## 2023-02-21 NOTE — ED Triage Notes (Signed)
Pt to ED for swelling and pain to right knee since Monday. Wound noted to knee with discharge, pt states unsure how wound got there. Denies fevers.

## 2023-02-21 NOTE — Discharge Instructions (Signed)
Follow-up with your primary care provider if any continued problems or concerns.  Return to the emergency department over the holiday weekend if any severe worsening of your symptoms or increased redness, fever, chills, nausea and inability to take the antibiotic.  Medication was was sent to your pharmacy along with some pain medication as needed.  Do not drive or operate machinery while taking the pain medication as it could cause drowsiness and increase your risk for injury.  Warm moist compresses frequently to your right knee as we discussed.

## 2023-02-21 NOTE — ED Notes (Signed)
Pt found wandering in the lobby. Pt states he hadn't been called back yet. Pt now taken to ED 45

## 2023-10-02 ENCOUNTER — Telehealth: Payer: Self-pay | Admitting: Physician Assistant

## 2023-10-02 NOTE — Telephone Encounter (Signed)
Received MR request from [facility] for 2024 office notes; faxed back last visit 07/19/21. No notes for 2024-Toni
